# Patient Record
Sex: Male | Born: 2000 | Race: Black or African American | Hispanic: No | Marital: Single | Smoking: Never smoker
Health system: Southern US, Community
[De-identification: ages and names within clinical notes are randomized; demographics above are authoritative.]

---
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
## 2018-09-08 IMAGING — US US RENAL
1 series · 14 of 25 positions shown · non-contrast
Comparison: None.

CLINICAL DATA: 16-year-old male with gross hematuria past week. No
pain. Initial encounter.

EXAM:
RENAL / URINARY TRACT ULTRASOUND COMPLETE

[Series 1: us renal · 0.22mm/px · 14 of 43 slices shown]
[im 1/43]
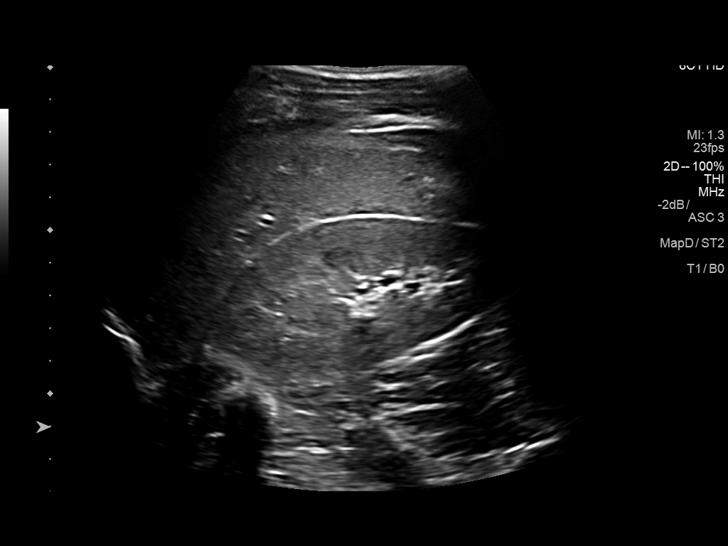
[im 4/43]
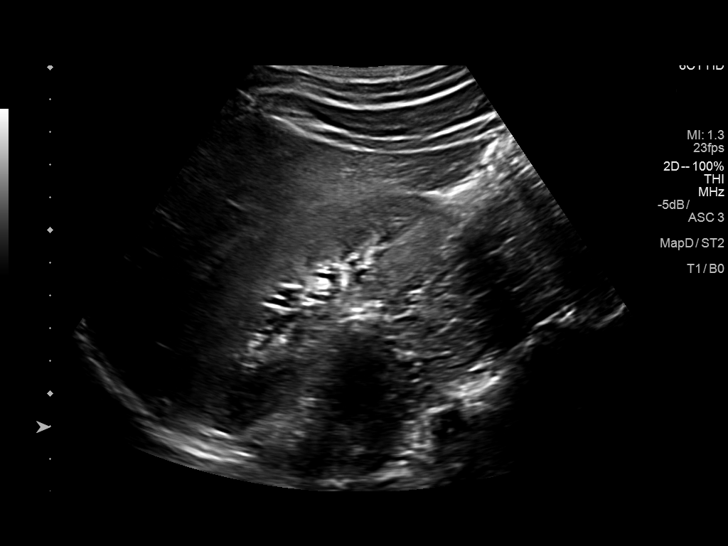
[im 8/43]
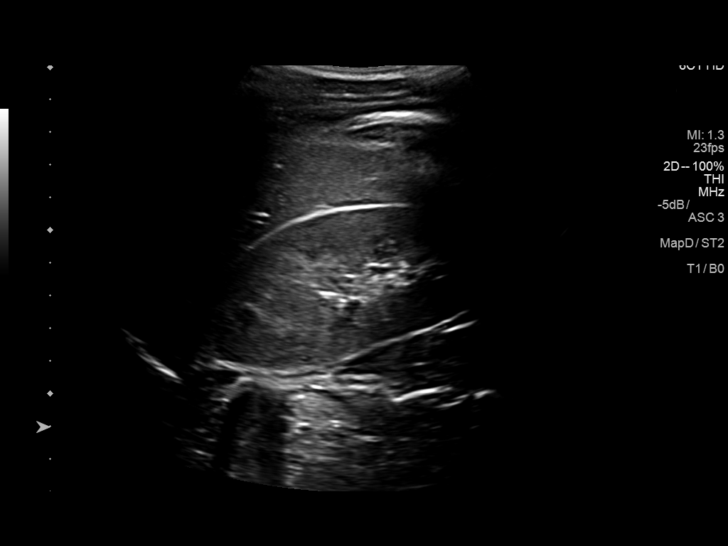
[im 11/43]
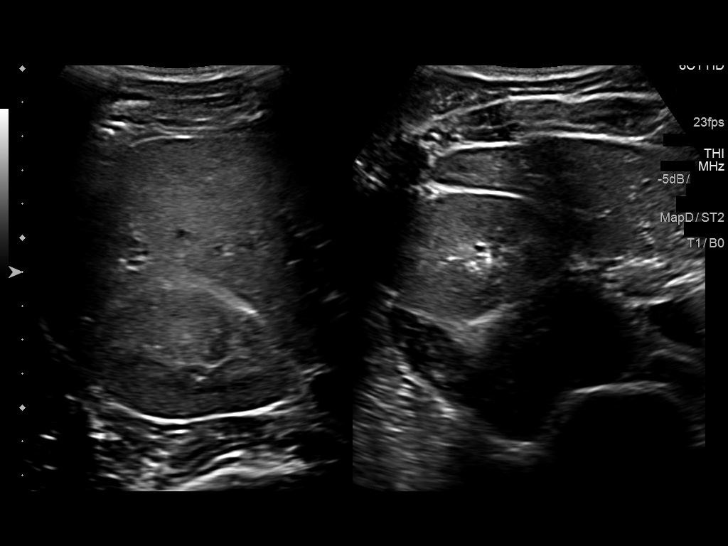
[im 15/43]
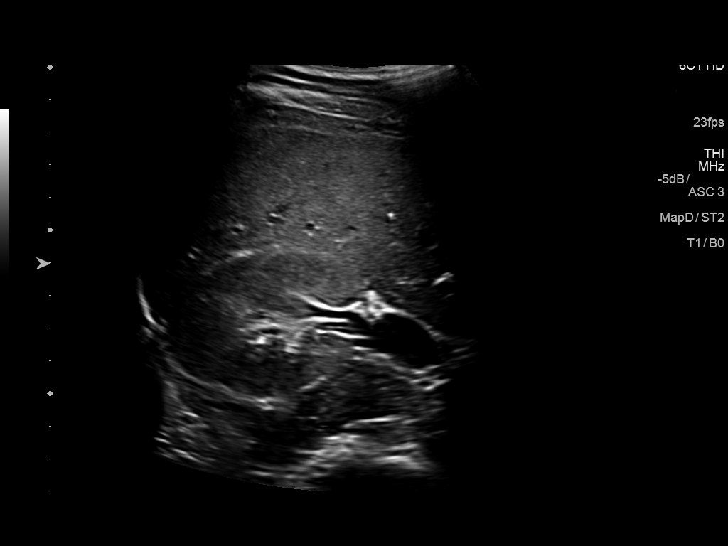
[im 16/43]
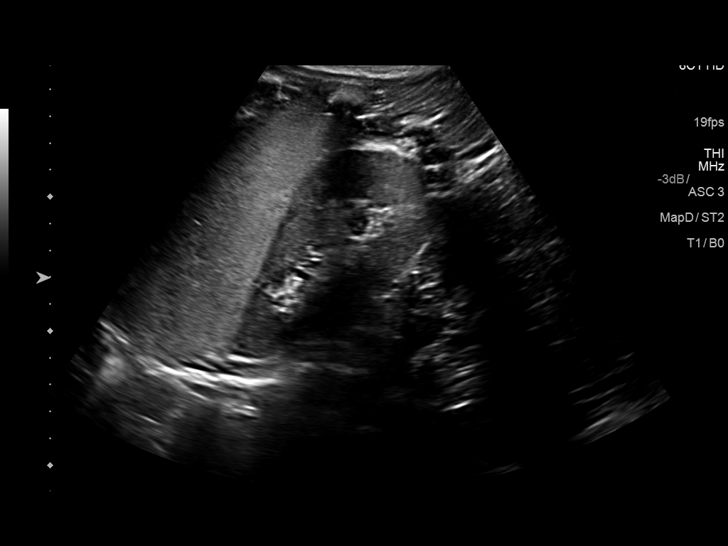
[im 20/43]
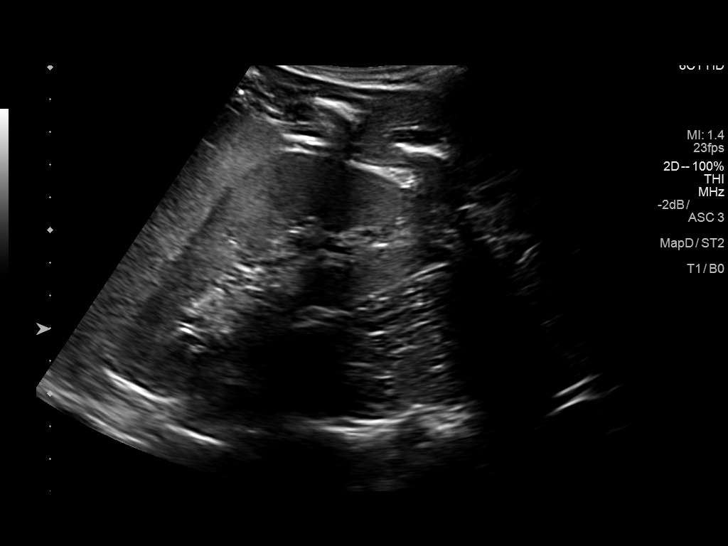
[im 23/43]
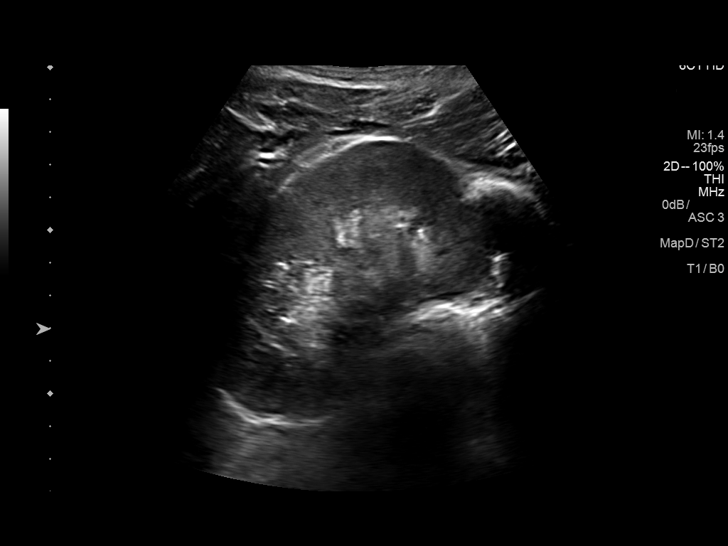
[im 27/43]
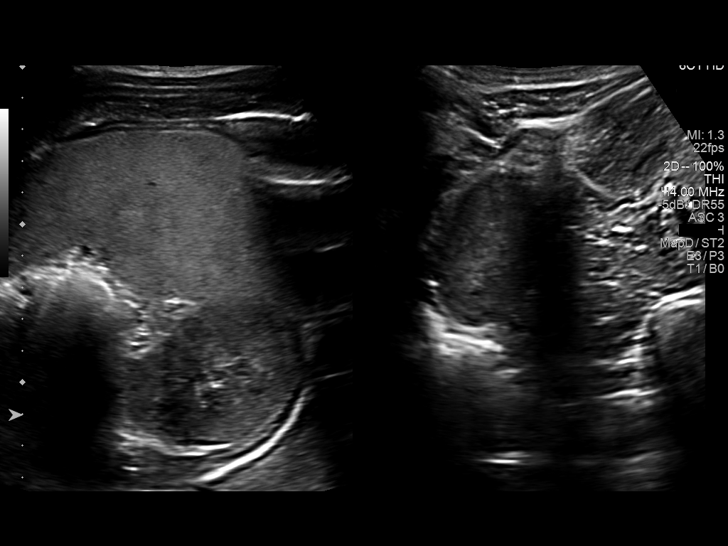
[im 29/43]
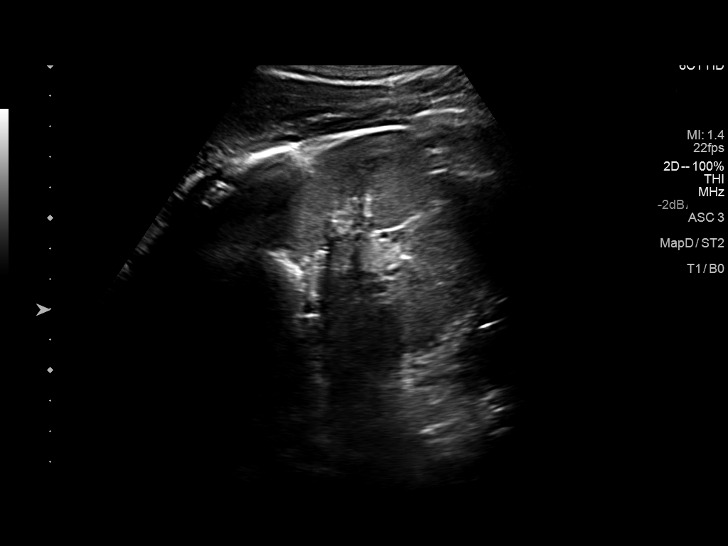
[im 32/43]
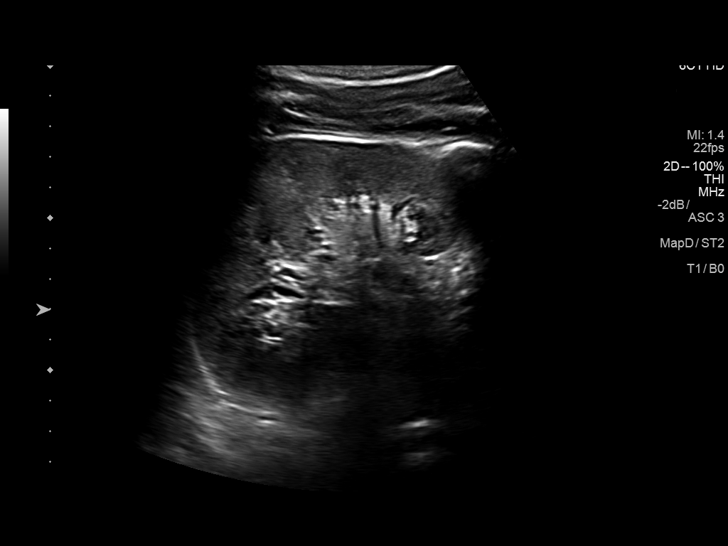
[im 36/43]
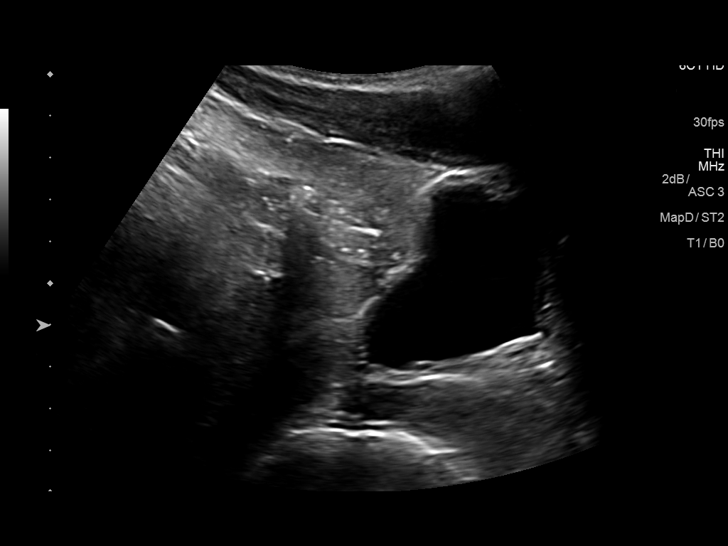
[im 39/43]
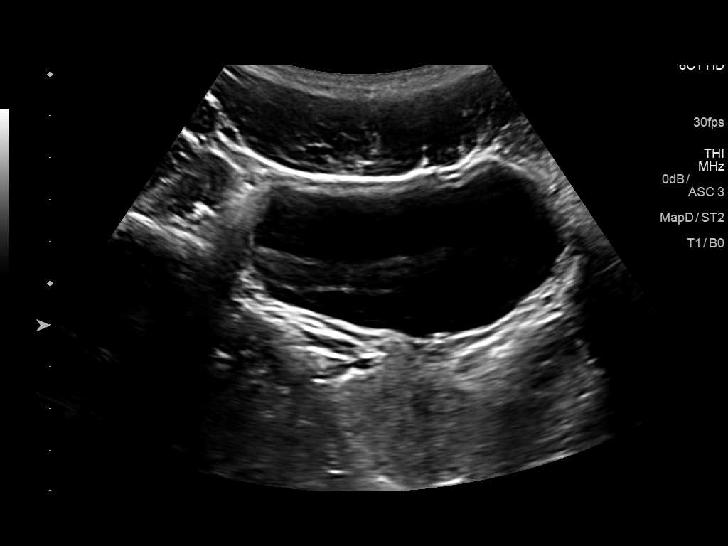
[im 43/43]
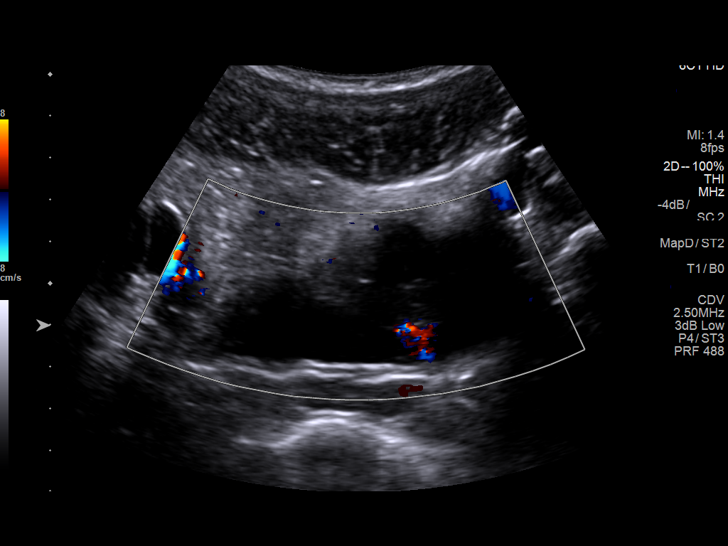

[14 of 25 positions shown; findings below may reference images not displayed]

FINDINGS: Right Kidney:

Length: 10.4 cm. Echogenicity within normal limits. No mass or
hydronephrosis visualized.

Left Kidney:

Length: 10.4 cm. Echogenicity within normal limits. No mass or
hydronephrosis visualized.

Bladder:

Appears normal for degree of bladder distention. Bilateral ureteral
jets noted.
IMPRESSION: Negative renal sonogram.  No renal calculi detected by ultrasound.
